# Patient Record
Sex: Female | Born: 1965 | Race: White | Hispanic: No | Marital: Married | Smoking: Former smoker
Health system: Southern US, Community
[De-identification: ages and names within clinical notes are randomized; demographics above are authoritative.]

## PROBLEM LIST (undated history)

## (undated) DIAGNOSIS — E785 Hyperlipidemia, unspecified: Secondary | ICD-10-CM

## (undated) DIAGNOSIS — F419 Anxiety disorder, unspecified: Secondary | ICD-10-CM

## (undated) DIAGNOSIS — K219 Gastro-esophageal reflux disease without esophagitis: Secondary | ICD-10-CM

## (undated) HISTORY — DX: Gastro-esophageal reflux disease without esophagitis: K21.9

## (undated) HISTORY — DX: Hyperlipidemia, unspecified: E78.5

## (undated) HISTORY — PX: OVARIAN CYST SURGERY: SHX726

## (undated) HISTORY — DX: Anxiety disorder, unspecified: F41.9

---

## 1997-11-24 ENCOUNTER — Ambulatory Visit (HOSPITAL_COMMUNITY): Admission: RE | Admit: 1997-11-24 | Discharge: 1997-11-24 | Payer: Self-pay | Admitting: Gynecology

## 1998-01-24 ENCOUNTER — Other Ambulatory Visit: Admission: RE | Admit: 1998-01-24 | Discharge: 1998-01-24 | Payer: Self-pay | Admitting: Obstetrics and Gynecology

## 1998-02-04 ENCOUNTER — Inpatient Hospital Stay (HOSPITAL_COMMUNITY): Admission: AD | Admit: 1998-02-04 | Discharge: 1998-02-07 | Payer: Self-pay | Admitting: Obstetrics and Gynecology

## 1999-01-05 ENCOUNTER — Encounter: Payer: Self-pay | Admitting: Obstetrics and Gynecology

## 1999-01-05 ENCOUNTER — Ambulatory Visit (HOSPITAL_COMMUNITY): Admission: RE | Admit: 1999-01-05 | Discharge: 1999-01-05 | Payer: Self-pay | Admitting: Obstetrics and Gynecology

## 1999-03-02 ENCOUNTER — Encounter: Admission: RE | Admit: 1999-03-02 | Discharge: 1999-05-31 | Payer: Self-pay | Admitting: Family Medicine

## 1999-05-22 ENCOUNTER — Other Ambulatory Visit: Admission: RE | Admit: 1999-05-22 | Discharge: 1999-05-22 | Payer: Self-pay | Admitting: Obstetrics and Gynecology

## 1999-09-18 ENCOUNTER — Ambulatory Visit (HOSPITAL_COMMUNITY): Admission: RE | Admit: 1999-09-18 | Discharge: 1999-09-18 | Payer: Self-pay | Admitting: Gynecology

## 1999-09-18 ENCOUNTER — Encounter: Payer: Self-pay | Admitting: Gynecology

## 2000-05-22 ENCOUNTER — Other Ambulatory Visit: Admission: RE | Admit: 2000-05-22 | Discharge: 2000-05-22 | Payer: Self-pay | Admitting: Gynecology

## 2001-07-28 ENCOUNTER — Other Ambulatory Visit: Admission: RE | Admit: 2001-07-28 | Discharge: 2001-07-28 | Payer: Self-pay | Admitting: Gynecology

## 2001-11-05 ENCOUNTER — Encounter: Admission: RE | Admit: 2001-11-05 | Discharge: 2001-11-05 | Payer: Self-pay | Admitting: Family Medicine

## 2001-11-05 ENCOUNTER — Encounter: Payer: Self-pay | Admitting: Family Medicine

## 2002-08-05 ENCOUNTER — Other Ambulatory Visit: Admission: RE | Admit: 2002-08-05 | Discharge: 2002-08-05 | Payer: Self-pay | Admitting: Gynecology

## 2003-08-09 ENCOUNTER — Other Ambulatory Visit: Admission: RE | Admit: 2003-08-09 | Discharge: 2003-08-09 | Payer: Self-pay | Admitting: Gynecology

## 2003-10-13 ENCOUNTER — Ambulatory Visit (HOSPITAL_COMMUNITY): Admission: RE | Admit: 2003-10-13 | Discharge: 2003-10-13 | Payer: Self-pay | Admitting: *Deleted

## 2004-08-09 ENCOUNTER — Other Ambulatory Visit: Admission: RE | Admit: 2004-08-09 | Discharge: 2004-08-09 | Payer: Self-pay | Admitting: Gynecology

## 2005-08-22 ENCOUNTER — Other Ambulatory Visit: Admission: RE | Admit: 2005-08-22 | Discharge: 2005-08-22 | Payer: Self-pay | Admitting: Gynecology

## 2005-10-23 ENCOUNTER — Ambulatory Visit (HOSPITAL_BASED_OUTPATIENT_CLINIC_OR_DEPARTMENT_OTHER): Admission: RE | Admit: 2005-10-23 | Discharge: 2005-10-23 | Payer: Self-pay | Admitting: Gynecology

## 2005-10-23 ENCOUNTER — Encounter (INDEPENDENT_AMBULATORY_CARE_PROVIDER_SITE_OTHER): Payer: Self-pay | Admitting: *Deleted

## 2006-12-23 ENCOUNTER — Other Ambulatory Visit: Admission: RE | Admit: 2006-12-23 | Discharge: 2006-12-23 | Payer: Self-pay | Admitting: Gynecology

## 2008-03-19 ENCOUNTER — Encounter: Admission: RE | Admit: 2008-03-19 | Discharge: 2008-03-19 | Payer: Self-pay | Admitting: Obstetrics and Gynecology

## 2010-06-17 ENCOUNTER — Emergency Department (HOSPITAL_COMMUNITY)
Admission: EM | Admit: 2010-06-17 | Discharge: 2010-06-17 | Payer: Self-pay | Source: Home / Self Care | Admitting: Emergency Medicine

## 2010-07-17 ENCOUNTER — Emergency Department (HOSPITAL_BASED_OUTPATIENT_CLINIC_OR_DEPARTMENT_OTHER)
Admission: EM | Admit: 2010-07-17 | Discharge: 2010-07-18 | Payer: Self-pay | Source: Home / Self Care | Admitting: Emergency Medicine

## 2010-07-19 LAB — URINE MICROSCOPIC-ADD ON

## 2010-07-19 LAB — URINALYSIS, ROUTINE W REFLEX MICROSCOPIC
Bilirubin Urine: NEGATIVE
Ketones, ur: NEGATIVE mg/dL
Nitrite: POSITIVE — AB
Protein, ur: NEGATIVE mg/dL
Specific Gravity, Urine: 1.02 (ref 1.005–1.030)
Urine Glucose, Fasting: NEGATIVE mg/dL
Urobilinogen, UA: 0.2 mg/dL (ref 0.0–1.0)
pH: 5.5 (ref 5.0–8.0)

## 2010-07-19 LAB — PREGNANCY, URINE: Preg Test, Ur: NEGATIVE

## 2010-07-23 ENCOUNTER — Encounter: Payer: Self-pay | Admitting: Family Medicine

## 2010-07-24 LAB — URINE CULTURE
Colony Count: >=100000
Culture  Setup Time: 201201170543

## 2010-09-11 LAB — URINE MICROSCOPIC-ADD ON

## 2010-09-11 LAB — URINALYSIS, ROUTINE W REFLEX MICROSCOPIC
Bilirubin Urine: NEGATIVE
Glucose, UA: NEGATIVE mg/dL
Ketones, ur: NEGATIVE mg/dL
Nitrite: NEGATIVE
Protein, ur: 30 mg/dL — AB
Specific Gravity, Urine: 1.028 (ref 1.005–1.030)
Urobilinogen, UA: 1 mg/dL (ref 0.0–1.0)
pH: 6.5 (ref 5.0–8.0)

## 2010-09-11 LAB — POCT PREGNANCY, URINE: Preg Test, Ur: NEGATIVE

## 2010-11-17 NOTE — Op Note (Signed)
NAMEMARQUETA, [name]              ACCOUNT NO.:  [id number]   MEDICAL RECORD NO.:  [id number]          PATIENT TYPE:  AMB   LOCATION:  [facility]                         FACILITY:  [facility]   PHYSICIAN:  Ivor Costa. Farrel Gobble, M.D. DATE OF BIRTH:  Jul 12, 1965   DATE OF PROCEDURE:  10/23/2005  DATE OF DISCHARGE:                                 OPERATIVE REPORT   PREOPERATIVE DIAGNOSIS:  Persistent complex left ovarian mass.   POSTOPERATIVE DIAGNOSIS:  Persistent complex left ovarian mass.   PROCEDURE:  Laparoscopic ovarian cystectomy and pelvic washing.   SURGEON:  Ivor Costa. Farrel Gobble, M.D.   ASSISTANTMarcial Pacas P. Fontaine, M.D.   ANESTHESIA:  General.   ESTIMATED BLOOD LOSS:  Minimal.   FINDINGS:  The left ovary was noted to be enlarged, it had smooth serosal  surfaces.  There was a left paratubal cyst.  The right adnexa was normal as  was the uterus and upper abdomen.   COMPLICATIONS:  None.   PATHOLOGY:  An ovarian cyst and a portion of a second cyst with cyst wall  sent as separate specimens.   PROCEDURE:  The patient was taken to the operating room, general anesthesia  was induced.  Placed in the dorsal lithotomy position, prepped and draped in  usual sterile fashion.  A bivalve speculum was placed in the vagina, the  cervix was visualized and a uterine manipulator was placed.  Gloves were  changed and an infraumbilical incision was made with a scalpel, through  which the Veress needle was inserted, the opening pressure was 7 and a  pneumoperitoneum was created until tympany was appreciated about the liver.  After which, a 10/11 disposable trocar was inserted through the  infraumbilical port and placement in the abdomen was confirmed.  The patient  was then placed in Trendelenburg position.  Two 5 mm ports were made in the  lower abdomen under direct visualization.  Once these ports were in place,  the bowels were swept out of the pelvis and the pelvic washings were  obtained.  The  ovary was then grasped at the tubo-ovarian ligament and held  for stabilization.  The Endoshears with cautery were then used to score the  serosa of the ovary and then the serosa was incised.  The capsule of the  ovary was then grasped and the cyst was begun to be sharply dissected off  with the Endoshears.  An incidental cystotomy occurred during this process  of clear fluid.  The cyst was able to be grasped and removed through the  umbilical port.  The ovary still appeared to be slightly enlarged and the  incision was carried a little more medially and a second cystic-like mass  was removed in a similar fashion.  The remainder of the ovary was probed and  was felt to be within normal limits.  The surface of the cyst was then  treated with cautery, the pelvis was irrigated to confirm that the cyst bed  itself was hemostatic.  The instruments were then removed from the abdomen  under direct visualization.  The infraumbilical port was closed with #0  Vicryl, the skin was closed with #3-0 plain.  The lower port, because of  bleeding at the skin, was also closed with #3-0 plain.  The three ports were  injected with a total of 10 mL of 0.25% Marcaine.  The patient tolerated the  procedure well.  Sponge and needle counts were correct x2 and she was  transferred to the PACU in stable condition.      Ivor Costa. Farrel Gobble, M.D.  Electronically Signed     THL/MEDQ  D:  10/23/2005  T:  10/23/2005  Job:  [id number]

## 2010-11-17 NOTE — H&P (Signed)
[name], [name]              ACCOUNT NO.:  [id number]   MEDICAL RECORD NO.:  [id number]          PATIENT TYPE:  AMB   LOCATION:  [facility]                         [facility]ACILITY:  [facility]   PHYSICIAN:  Ivor Costa. Farrel Gobble, M.D. DATE OF BIRTH:  Apr 28, 1966   DATE OF ADMISSION:  DATE OF DISCHARGE:                                HISTORY & PHYSICAL   CHIEF COMPLAINT:  Persistent left ovarian mass.   HISTORY OF PRESENT ILLNESS:  The patient is a 45 year old who was diagnosed  with a complex cyst on the left side in December of 2006 when she had  presented to urgent care with pelvic pain.  The patient followed back up in  our office for a repeat ultrasound, which basically showed the cyst to be  unchanged.  There was only about 4 weeks between the first 2 scans, and the  patient therefore represented back for a follow up scan in April, which  shows the cyst to be persistent in size.  It is cystic, solid in nature,  with measurements of 2.8 x 3 x 2.1.  It is noted to be thick walled.  It is  unchanged across 3 scans.  The remainder of her pelvis is unremarkable.  The  patient had a CA125, which is normal.  She does report pain on the left hand  side.  The patient, otherwise, has no other complaints.  She has had 2  cesarean sections.  She uses contraception with a vasectomy.  She has no  dyspareunia, and her cycles are 28 days.   PAST MEDICAL HISTORY:  Negative.   SURGICAL HISTORY:  Cesarean section in 1991 and 1999.   SOCIAL HISTORY:  Negative for ovarian, uterine or colon cancer.  There is  breast cancer in a grandmother.   SOCIAL HISTORY:  Negative for tobacco.  Social alcohol.  No formal exercise.   PHYSICAL EXAMINATION:  GENERAL:  She is well appearing, in no acute  distress.  HEART:  Regular rate.  LUNGS:  Clear to auscultation.  ABDOMEN:  Soft and nontender.  GYN:  She has thick white discharge in the vault, and a wet prep was taken.  The cervix is without gross lesions.  The uterus is  mobile and nontender, as  were the adnexa.  RECTOVAGINAL:  Deferred.   ASSESSMENT:  Persistent left ovarian cyst.   The patient will present to the OR for a laparoscopic ovarian cystectomy,  possible oophorectomy with pelvic washings.  Risks and benefits of the  procedure were discussed.  Postoperative pain medication in the form of  Tylox was given.      Ivor Costa. Farrel Gobble, M.D.  Electronically Signed     THL/MEDQ  D:  10/19/2005  T:  10/19/2005  Job:  [id number]

## 2013-12-25 DIAGNOSIS — E559 Vitamin D deficiency, unspecified: Secondary | ICD-10-CM | POA: Insufficient documentation

## 2013-12-25 DIAGNOSIS — IMO0001 Reserved for inherently not codable concepts without codable children: Secondary | ICD-10-CM | POA: Insufficient documentation

## 2013-12-25 DIAGNOSIS — K219 Gastro-esophageal reflux disease without esophagitis: Secondary | ICD-10-CM | POA: Insufficient documentation

## 2013-12-25 DIAGNOSIS — G4700 Insomnia, unspecified: Secondary | ICD-10-CM | POA: Insufficient documentation

## 2013-12-25 DIAGNOSIS — R350 Frequency of micturition: Secondary | ICD-10-CM | POA: Insufficient documentation

## 2013-12-25 DIAGNOSIS — R5383 Other fatigue: Secondary | ICD-10-CM | POA: Insufficient documentation

## 2014-04-27 DIAGNOSIS — F411 Generalized anxiety disorder: Secondary | ICD-10-CM | POA: Insufficient documentation

## 2014-04-27 DIAGNOSIS — E7800 Pure hypercholesterolemia, unspecified: Secondary | ICD-10-CM | POA: Insufficient documentation

## 2014-04-27 DIAGNOSIS — R0989 Other specified symptoms and signs involving the circulatory and respiratory systems: Secondary | ICD-10-CM | POA: Insufficient documentation

## 2014-06-08 ENCOUNTER — Other Ambulatory Visit: Payer: Self-pay | Admitting: Gastroenterology

## 2014-06-08 DIAGNOSIS — R4702 Dysphasia: Secondary | ICD-10-CM

## 2014-06-11 ENCOUNTER — Other Ambulatory Visit: Payer: Self-pay | Admitting: Gastroenterology

## 2014-06-11 ENCOUNTER — Other Ambulatory Visit: Payer: Self-pay

## 2014-06-11 ENCOUNTER — Ambulatory Visit
Admission: RE | Admit: 2014-06-11 | Discharge: 2014-06-11 | Disposition: A | Discharge status: Home / Self Care | Payer: 59 | Source: Ambulatory Visit | Attending: Gastroenterology | Admitting: Gastroenterology

## 2014-06-11 DIAGNOSIS — R4702 Dysphasia: Secondary | ICD-10-CM

## 2015-07-15 DIAGNOSIS — Z2821 Immunization not carried out because of patient refusal: Secondary | ICD-10-CM | POA: Insufficient documentation

## 2016-02-24 ENCOUNTER — Other Ambulatory Visit: Payer: Self-pay

## 2016-02-24 ENCOUNTER — Other Ambulatory Visit: Payer: Self-pay | Admitting: Obstetrics and Gynecology

## 2016-02-24 DIAGNOSIS — R109 Unspecified abdominal pain: Secondary | ICD-10-CM

## 2016-02-24 DIAGNOSIS — R1030 Lower abdominal pain, unspecified: Secondary | ICD-10-CM

## 2016-02-29 ENCOUNTER — Ambulatory Visit
Admission: RE | Admit: 2016-02-29 | Discharge: 2016-02-29 | Disposition: A | Discharge status: Home / Self Care | Payer: 59 | Source: Ambulatory Visit | Attending: Obstetrics and Gynecology | Admitting: Obstetrics and Gynecology

## 2016-02-29 DIAGNOSIS — R109 Unspecified abdominal pain: Secondary | ICD-10-CM

## 2016-02-29 DIAGNOSIS — R1030 Lower abdominal pain, unspecified: Secondary | ICD-10-CM

## 2016-08-03 ENCOUNTER — Encounter: Payer: Self-pay | Admitting: Gastroenterology

## 2016-09-10 ENCOUNTER — Encounter: Payer: Self-pay | Admitting: Gastroenterology

## 2016-09-10 ENCOUNTER — Ambulatory Visit (INDEPENDENT_AMBULATORY_CARE_PROVIDER_SITE_OTHER): Payer: 59 | Admitting: Gastroenterology

## 2016-09-10 VITALS — BP 128/76 | HR 70 | Ht 66.75 in | Wt 158.0 lb

## 2016-09-10 DIAGNOSIS — K6289 Other specified diseases of anus and rectum: Secondary | ICD-10-CM | POA: Diagnosis not present

## 2016-09-10 DIAGNOSIS — K625 Hemorrhage of anus and rectum: Secondary | ICD-10-CM

## 2016-09-10 DIAGNOSIS — K5900 Constipation, unspecified: Secondary | ICD-10-CM | POA: Diagnosis not present

## 2016-09-10 DIAGNOSIS — R58 Hemorrhage, not elsewhere classified: Secondary | ICD-10-CM

## 2016-09-10 MED ORDER — LINACLOTIDE 72 MCG PO CAPS: 72.0000 ug | ORAL_CAPSULE | Freq: Every day | ORAL | 3 refills | Status: DC

## 2016-09-10 NOTE — Progress Notes (Signed)
Edward Trevino    [id number]    [birth year]  Primary Care Physician:KELLY,SAM, MD  Referring Physician: No referring provider defined for this encounter.  Chief complaint:  Rectal pain  HPI: 51 year old female here for new patient evaluation with complaints of rectal pain just prior to defecation for past few months. She has history of chronic constipation with bowel movements once every week. Not on any laxatives, Denies any abdominal discomfort or pain. No nausea or vomiting. She has occasional heartburn, acid reflux symptoms. She has pain in the rectum and also back pain  when she has a bowel movement and has noticed intermittently bright red blood per rectum when she wipes and also in the toilet. She was evaluated by [facility] about 2 years ago and has had colonoscopy with removal of a polyp per patient, report not available during this visit to review. she was informed that the polyp was benign No change in weight. She has history of ovarian serous cystadenoma.  No uterine fibroids. Had recent GYN ultrasound that was unremarkable per patient   Outpatient Encounter Prescriptions as of 09/10/2016  Medication Sig  . buPROPion (WELLBUTRIN XL) 150 MG 24 hr tablet Take 150 mg by mouth daily.   Marland Kitchen escitalopram (LEXAPRO) 20 MG tablet Take 20 mg by mouth daily.   Marland Kitchen omeprazole (PRILOSEC) 40 MG capsule Take 40 mg by mouth daily.   . pravastatin (PRAVACHOL) 40 MG tablet Take 40 mg by mouth daily.   No facility-administered encounter medications on file as of 09/10/2016.     Allergies as of 09/10/2016  . (Not on File)    Past Medical History:  Diagnosis Date  . Anxiety   . GERD (gastroesophageal reflux disease)   . Hyperlipidemia     Past Surgical History:  Procedure Laterality Date  . CESAREAN SECTION    . OVARIAN CYST SURGERY      Family History  Problem Relation Age of Onset  . Hypertension Mother   . Prostate cancer Father   . Heart disease Father   . Breast  cancer Maternal Grandmother     Social History   Social History  . Marital status: Married    Spouse name: N/A  . Number of children: 2  . Years of education: N/A   Occupational History  . Emergency planning/management officer    Social History Main Topics  . Smoking status: Former Games developer  . Smokeless tobacco: Never Used  . Alcohol use No  . Drug use: Yes    Frequency: 7.0 times per week    Types: Marijuana  . Sexual activity: Not on file   Other Topics Concern  . Not on file   Social History Narrative  . No narrative on file      Review of systems: Review of Systems  Constitutional: Negative for fever and chills.  HENT: Negative.   Eyes: Negative for blurred vision.  Respiratory: Negative for cough, shortness of breath and wheezing.   Cardiovascular: Negative for chest pain and palpitations.  Gastrointestinal: as per HPI Genitourinary: Negative for dysuria, urgency, frequency and hematuria.  Musculoskeletal: Negative for myalgias, back pain and joint pain.  Skin: Negative for itching and rash.  Neurological: Negative for dizziness, tremors, focal weakness, seizures and loss of consciousness.  Endo/Heme/Allergies: Positive for seasonal allergies.  Psychiatric/Behavioral: Negative for depression, suicidal ideas and hallucinations.  All other systems reviewed and are negative.   Physical Exam: Vitals:   09/10/16 0931  BP:  128/76  Pulse: 70   Body mass index is 24.93 kg/m. Gen:      No acute distress HEENT:  EOMI, sclera anicteric Neck:     No masses; no thyromegaly Lungs:    Clear to auscultation bilaterally; normal respiratory effort CV:         Regular rate and rhythm; no murmurs Abd:      + bowel sounds; soft, non-tender; no palpable masses, no distension Ext:    No edema; adequate peripheral perfusion Skin:      Warm and dry; no rash Neuro: alert and oriented x 3 Psych: normal mood and affect Rectal exam: Normal anal sphincter tone, no anal fissure or external  hemorrhoids Anoscopy: Small internal hemorrhoids, no active bleeding, normal dentate line, no visible nodules  Data Reviewed:  Reviewed labs, radiology imaging, old records and pertinent past GI work up   Assessment and Plan/Recommendations: [age] yr F here with c/o chronic constipation, rectal pain with defecation and intermittent bright red blood per rectum  Rectal/back pain could be related to increased stool burden No evidence of anal fistula or fissure. No tenderness on rectal exam Start Linzess 72 mcg daily Increase dietary fiber and fluid intake  Intermittent blood per rectum: she does have Grade 2 internal hemorrhoids Small volume bleeding likely secondary to hemorrhoidal bleeding but cannot exclude proctitis or inflammatory polyp or malignancy [facility], it was done 2 years ago per patient Preparation H for 7-10 days as needed  Reassess in 4 weeks, if no improvement will consider flex sig or colonoscopy for further evaluation   Greater than 50% of the time used for counseling as well as treatment plan and follow-up. She had multiple questions which were answered to her satisfaction  K. Scherry RanVeena Breezy Hertenstein , MD [phone] Mon-Fri 8a-5p [phone] after 5p, weekends, holidays  CC: No ref. provider found

## 2016-09-10 NOTE — Patient Instructions (Signed)
[facility]  Take Linzess 72 mcg daily, we will send to your pharmacy  Increase Fluid Intake 8-10 cups daily  Use Preparation H per rectum at bedtime for 7-10 days

## 2016-10-22 ENCOUNTER — Ambulatory Visit: Payer: Self-pay | Admitting: Gastroenterology

## 2017-06-04 DIAGNOSIS — M545 Low back pain, unspecified: Secondary | ICD-10-CM | POA: Insufficient documentation

## 2017-06-04 DIAGNOSIS — G8929 Other chronic pain: Secondary | ICD-10-CM | POA: Insufficient documentation

## 2017-09-17 ENCOUNTER — Other Ambulatory Visit: Payer: Self-pay | Admitting: Obstetrics and Gynecology

## 2017-09-17 DIAGNOSIS — E229 Hyperfunction of pituitary gland, unspecified: Principal | ICD-10-CM

## 2017-09-17 DIAGNOSIS — R7989 Other specified abnormal findings of blood chemistry: Secondary | ICD-10-CM

## 2017-09-19 ENCOUNTER — Ambulatory Visit
Admission: RE | Admit: 2017-09-19 | Discharge: 2017-09-19 | Disposition: A | Discharge status: Home / Self Care | Payer: 59 | Source: Ambulatory Visit | Attending: Obstetrics and Gynecology | Admitting: Obstetrics and Gynecology

## 2017-09-19 DIAGNOSIS — E229 Hyperfunction of pituitary gland, unspecified: Principal | ICD-10-CM

## 2017-09-19 DIAGNOSIS — R7989 Other specified abnormal findings of blood chemistry: Secondary | ICD-10-CM

## 2017-09-19 MED ORDER — GADOBENATE DIMEGLUMINE 529 MG/ML IV SOLN
10.0000 mL | Freq: Once | INTRAVENOUS | Status: AC | PRN
  Administered 2017-09-19: 10 mL via INTRAVENOUS

## 2017-10-07 ENCOUNTER — Ambulatory Visit (INDEPENDENT_AMBULATORY_CARE_PROVIDER_SITE_OTHER): Payer: 59

## 2017-10-07 ENCOUNTER — Encounter: Payer: Self-pay | Admitting: Podiatry

## 2017-10-07 ENCOUNTER — Ambulatory Visit: Payer: 59 | Admitting: Podiatry

## 2017-10-07 DIAGNOSIS — M779 Enthesopathy, unspecified: Secondary | ICD-10-CM | POA: Diagnosis not present

## 2017-10-07 DIAGNOSIS — M2012 Hallux valgus (acquired), left foot: Secondary | ICD-10-CM

## 2017-10-07 DIAGNOSIS — M2011 Hallux valgus (acquired), right foot: Secondary | ICD-10-CM | POA: Diagnosis not present

## 2017-10-07 DIAGNOSIS — M7752 Other enthesopathy of left foot: Secondary | ICD-10-CM

## 2017-10-07 DIAGNOSIS — M79675 Pain in left toe(s): Secondary | ICD-10-CM

## 2017-10-07 MED ORDER — METHYLPREDNISOLONE 4 MG PO TBPK: ORAL_TABLET | ORAL | 0 refills | Status: AC

## 2017-10-07 MED ORDER — MELOXICAM 15 MG PO TABS: 15.0000 mg | ORAL_TABLET | Freq: Every day | ORAL | 1 refills | Status: AC

## 2017-10-08 ENCOUNTER — Other Ambulatory Visit: Payer: Self-pay | Admitting: Podiatry

## 2017-10-08 DIAGNOSIS — M2012 Hallux valgus (acquired), left foot: Principal | ICD-10-CM

## 2017-10-08 DIAGNOSIS — M2011 Hallux valgus (acquired), right foot: Secondary | ICD-10-CM

## 2017-10-09 NOTE — Progress Notes (Signed)
   Subjective: 52 year old female presenting today as a new patient with a chief complaint of shooting, throbbing pain to the bilateral 1st MPJs secondary to bunions that have been symptomatic for the past three weeks. She states the left is worse than the right. She has not done anything to treat the pain. Standing and walking increase the symptoms. Patient is here for further evaluation and treatment.   Past Medical History:  Diagnosis Date  . Anxiety   . GERD (gastroesophageal reflux disease)   . Hyperlipidemia       Objective: Physical Exam General: The patient is alert and oriented x3 in no acute distress.  Dermatology: Skin is cool, dry and supple bilateral lower extremities. Negative for open lesions or macerations.  Vascular: Palpable pedal pulses bilaterally. No edema or erythema noted. Capillary refill within normal limits.  Neurological: Epicritic and protective threshold grossly intact bilaterally.   Musculoskeletal Exam: Clinical evidence of bunion deformity noted to the respective foot. There is a moderate pain on palpation range of motion of the first MPJ. Lateral deviation of the hallux noted consistent with hallux abductovalgus.  Radiographic Exam: Increased intermetatarsal angle greater than 15 with a hallux abductus angle greater than 30 noted on AP view. Moderate degenerative changes noted within the first MPJ.  Assessment: 1. HAV w/ bunion deformity bilateral 2. 1st MPJ capsulitis left    Plan of Care:  1. Patient was evaluated. X-Rays reviewed. 2. Declined injections today.  3. Prescription for Medrol Dose Pak provided to patient.  4. Prescription for Meloxicam provided to patient.  5. Recommended good shoe gear.  6. Return to clinic in 4 weeks.    Felecia ShellingBrent M. Evans, DPM [facility]  Dr. Felecia ShellingBrent M. Evans, DPM    [address]                                        [place], [place] [address]                Office [phone]  Fax  [phone]

## 2017-11-04 ENCOUNTER — Ambulatory Visit: Payer: 59 | Admitting: Podiatry

## 2017-11-13 ENCOUNTER — Ambulatory Visit: Payer: 59 | Admitting: Podiatry

## 2017-12-02 ENCOUNTER — Encounter: Payer: 59 | Admitting: Podiatry

## 2017-12-05 NOTE — Progress Notes (Signed)
This encounter was created in error - please disregard.

## 2019-08-28 IMAGING — MR MR HEAD WO/W CM
16 of 19 series · 35 of 48 positions shown · IV contrast (10 ML MULTIHANCE)
Comparison: None.

CLINICAL DATA: Elevated prolactin level.  Decreased vision.

Creatinine was obtained on site at [HOSPITAL] at [HOSPITAL].
Results: Creatinine 1.2 mg/dL.
EXAM:
MRI HEAD WITHOUT AND WITH CONTRAST
TECHNIQUE: Multiplanar, multiecho pulse sequences of the brain and surrounding
structures were obtained without and with intravenous contrast.
CONTRAST:  10mL MULTIHANCE GADOBENATE DIMEGLUMINE 529 MG/ML IV SOLN

[Series 2: t1_se_sag · sagittal · 5.0mm · 0.45mm/px · 1 of 19 slices shown]
[im 1/19]
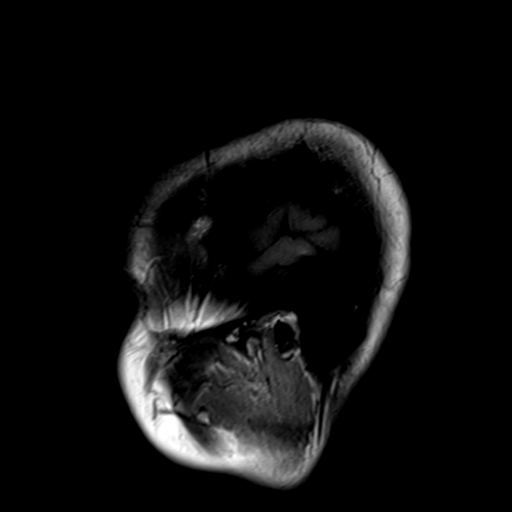

[Series 3: ep2d_diff_(id)_trace · axial · 3.0mm · 1.80mm/px · z∈[-31,+116]mm · 8 of 100 slices shown]
[im 1/100]
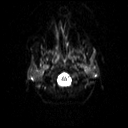
[im 20/100]
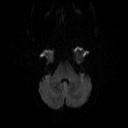
[im 30/100]
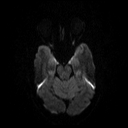
[im 40/100]
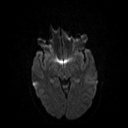
[im 60/100]
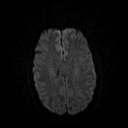
[im 70/100]
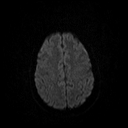
[im 80/100]
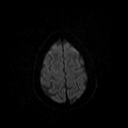
[im 100/100]
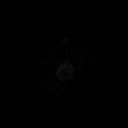

[Series 4: ep2d_diff_(id)_trace_adc · axial · 3.0mm · 1.80mm/px · z∈[-31,+116]mm · 5 of 49 slices shown]
[im 1/49]
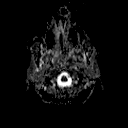
[im 13/49]
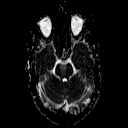
[im 25/49]
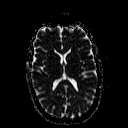
[im 37/49]
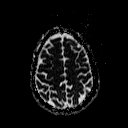
[im 49/49]
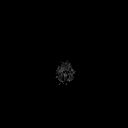

[Series 5: T2 · axial · 5.0mm · 0.45mm/px · z∈[-26,+111]mm · 3 of 22 slices shown]
[im 1/22]
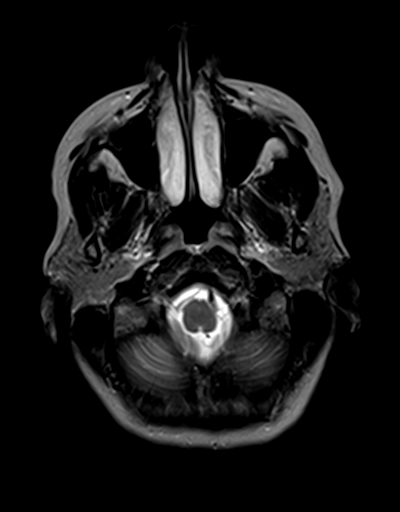
[im 11/22]
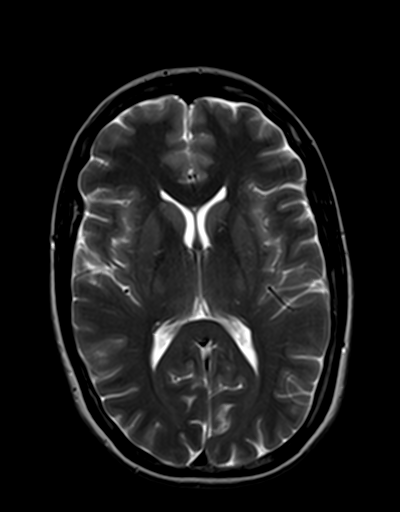
[im 22/22]
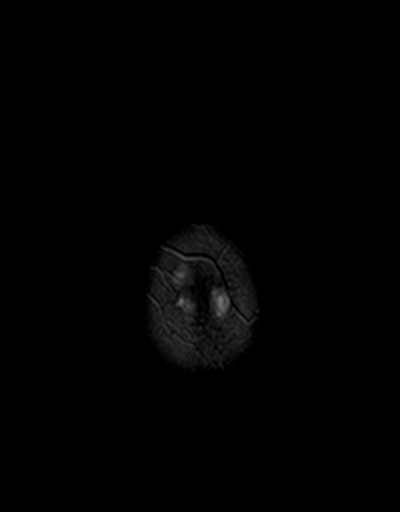

[Series 6: FLAIR · axial · 5.0mm · 0.45mm/px · z∈[-25,+111]mm · 3 of 22 slices shown]
[im 1/22]
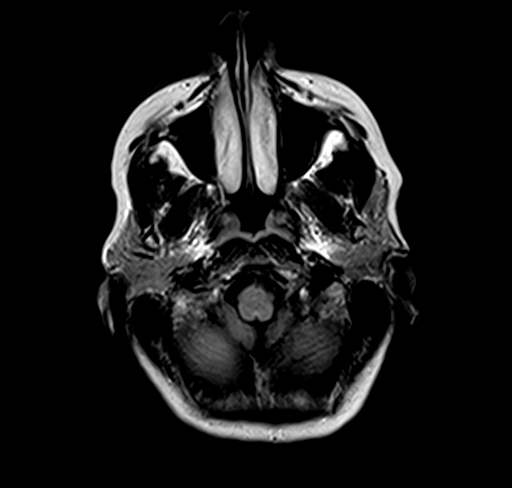
[im 11/22]
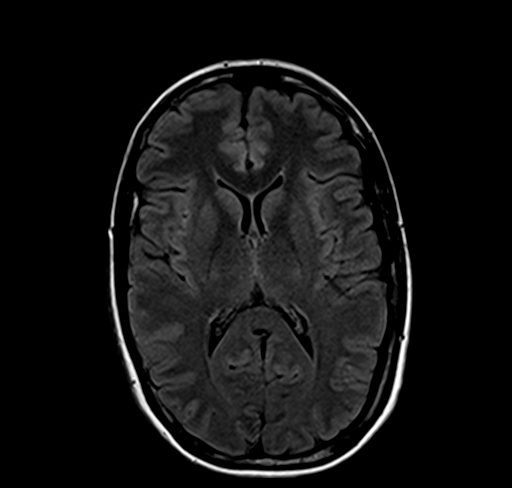
[im 22/22]
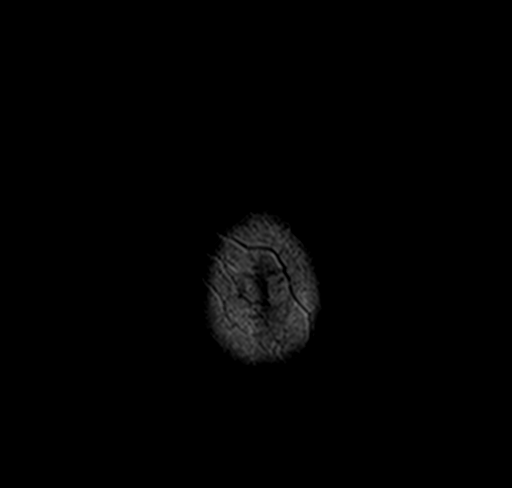

[Series 7: GRE · axial · 5.0mm · 0.45mm/px · z∈[-26,+110]mm · 3 of 22 slices shown]
[im 1/22]
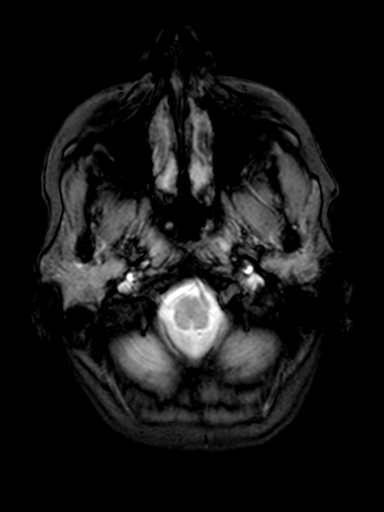
[im 11/22]
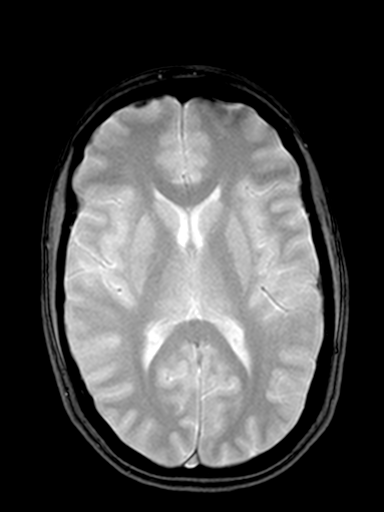
[im 22/22]
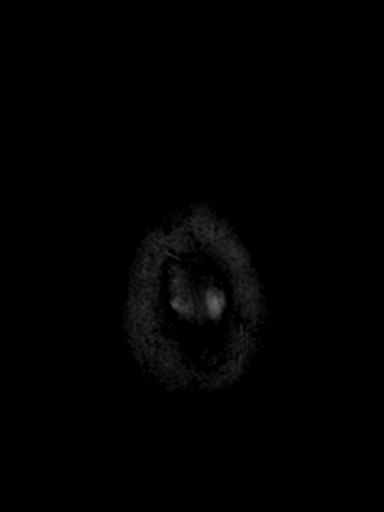

[Series 8: T1 · sagittal · 3.0mm · 0.35mm/px · 1 of 11 slices shown (1 of 2)]
[im 1/11]
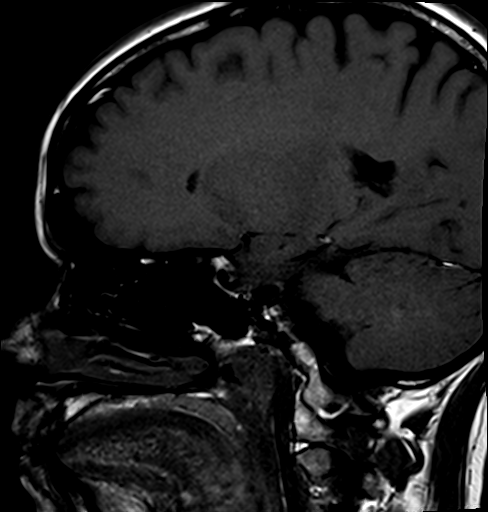

[Series 9: T1 · coronal · 3.0mm · 0.35mm/px · 1 of 11 slices shown (2 of 2)]
[im 1/11]
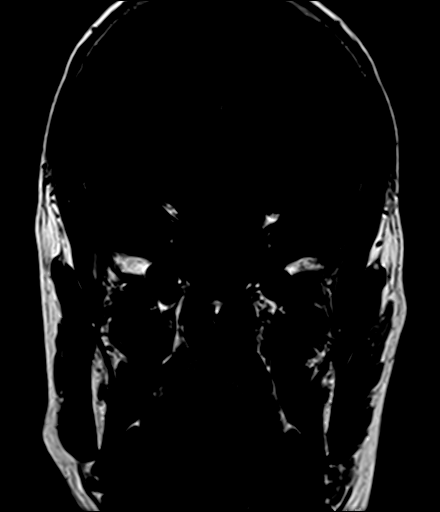

[Series 10: pre cor · coronal · non-contrast · 3.0mm · 0.35mm/px · 1 of 6 slices shown]
[im 1/6]
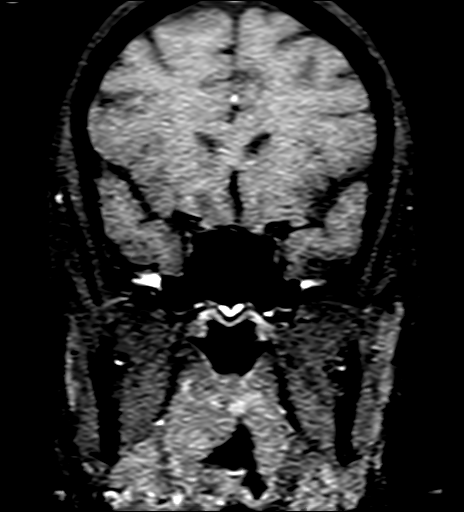

[Series 11: post cor dynamic · coronal · 3.0mm · 0.35mm/px · 1 of 6 slices shown (1 of 4)]
[im 1/6]
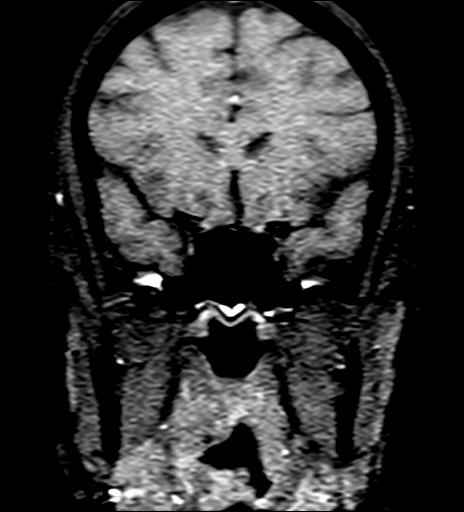

[Series 12: post cor dynamic · coronal · 3.0mm · 0.35mm/px · 1 of 6 slices shown (2 of 4)]
[im 1/6]
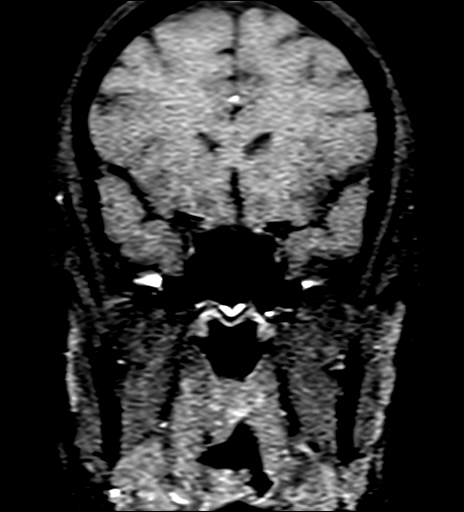

[Series 13: post cor dynamic · coronal · 3.0mm · 0.35mm/px · 1 of 6 slices shown (3 of 4)]
[im 1/6]
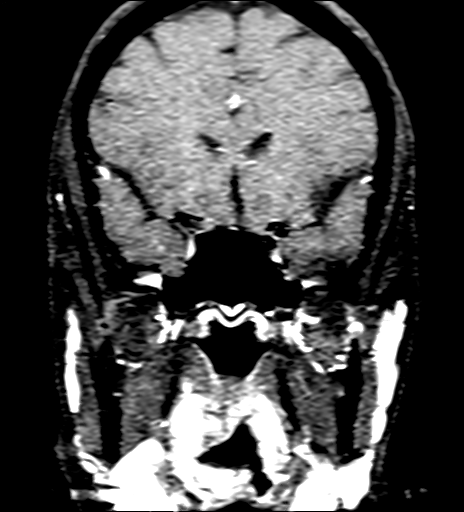

[Series 14: post cor dynamic · coronal · 3.0mm · 0.35mm/px · 1 of 6 slices shown (4 of 4)]
[im 1/6]
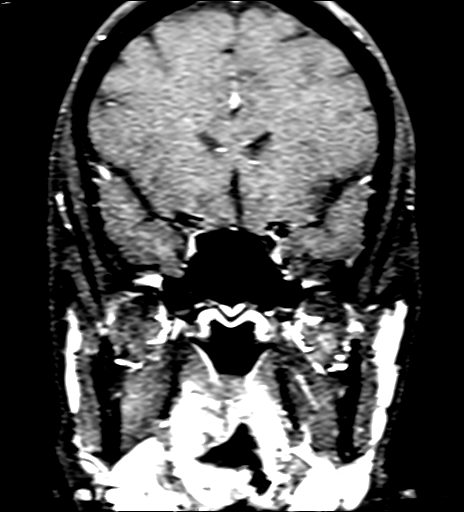

[Series 17: T1 post-contrast · coronal · 3.0mm · 0.35mm/px · 1 of 11 slices shown (1 of 3)]
[im 1/11]
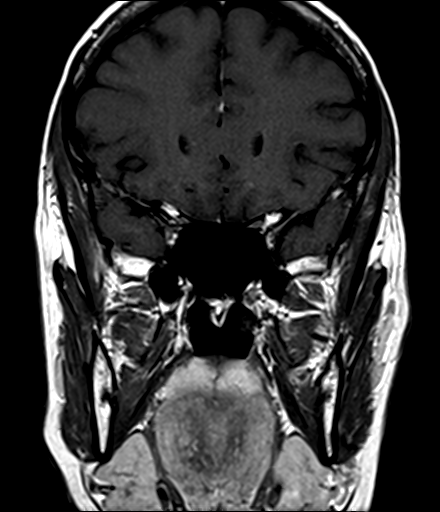

[Series 18: T1 post-contrast · sagittal · 3.0mm · 0.35mm/px · 1 of 11 slices shown (2 of 3)]
[im 1/11]
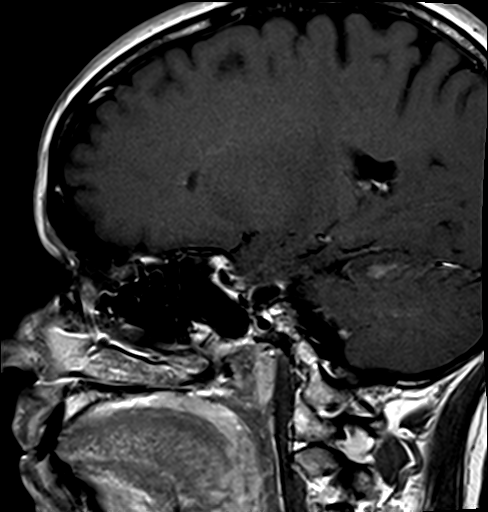

[Series 20: T1 post-contrast · coronal · 5.0mm · 0.69mm/px · 3 of 25 slices shown (3 of 3)]
[im 1/25]
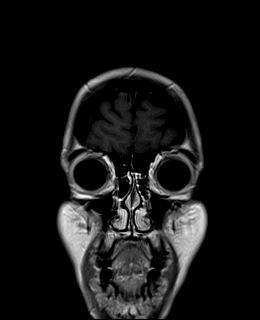
[im 13/25]
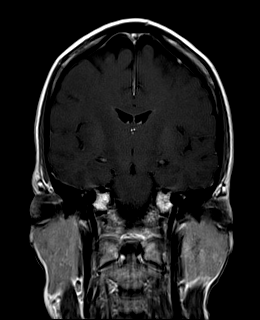
[im 25/25]
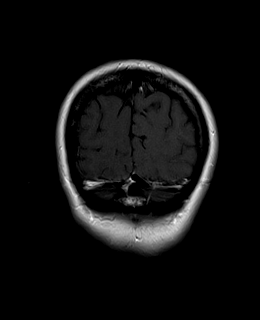

[35 of 48 positions shown; findings below may reference images not displayed]

FINDINGS: Brain: There is no evidence of acute infarct, intracranial
hemorrhage, mass, midline shift, or extra-axial fluid collection.
The ventricles and sulci are normal. The brain is normal in signal.
No abnormal enhancement is identified.

Dedicated pituitary imaging was performed. The pituitary is normal
in size and morphology. There is a normal posterior pituitary bright
spot. Pituitary enhancement is normal without a sellar or
suprasellar lesion identified. The infundibulum is midline. The
optic chiasm and cavernous sinuses are unremarkable.

Vascular: Major intracranial vascular flow voids are preserved with
the left vertebral artery appearing dominant.

Skull and upper cervical spine: Unremarkable bone marrow signal.

Sinuses/Orbits: Unremarkable orbits. Paranasal sinuses and mastoid
air cells are clear.

Other: None.
IMPRESSION: Negative brain and pituitary imaging.

## 2021-08-26 ENCOUNTER — Other Ambulatory Visit: Payer: Self-pay

## 2021-08-26 ENCOUNTER — Encounter (HOSPITAL_BASED_OUTPATIENT_CLINIC_OR_DEPARTMENT_OTHER): Payer: Self-pay

## 2021-08-26 ENCOUNTER — Emergency Department (HOSPITAL_BASED_OUTPATIENT_CLINIC_OR_DEPARTMENT_OTHER)
Admission: EM | Admit: 2021-08-26 | Discharge: 2021-08-26 | Disposition: A | Discharge status: Home / Self Care | Payer: 59 | Attending: Emergency Medicine | Admitting: Emergency Medicine

## 2021-08-26 DIAGNOSIS — R221 Localized swelling, mass and lump, neck: Secondary | ICD-10-CM | POA: Diagnosis not present

## 2021-08-26 DIAGNOSIS — Z5321 Procedure and treatment not carried out due to patient leaving prior to being seen by health care provider: Secondary | ICD-10-CM | POA: Insufficient documentation

## 2021-08-26 DIAGNOSIS — R070 Pain in throat: Secondary | ICD-10-CM | POA: Insufficient documentation

## 2021-08-26 LAB — GROUP A STREP BY PCR: Group A Strep by PCR: NOT DETECTED

## 2021-08-26 NOTE — ED Notes (Signed)
Per registration, patient left after triage.  Unknown reason.

## 2021-08-26 NOTE — ED Triage Notes (Signed)
Patient complains of sore throat which began around 30 minutes ago.  She also complains of swelling to her neck.

## 2024-07-10 ENCOUNTER — Encounter: Payer: Self-pay | Admitting: Family Medicine

## 2024-07-10 DIAGNOSIS — R109 Unspecified abdominal pain: Secondary | ICD-10-CM

## 2024-07-13 ENCOUNTER — Other Ambulatory Visit: Payer: Self-pay | Admitting: Family Medicine

## 2024-07-13 DIAGNOSIS — R109 Unspecified abdominal pain: Secondary | ICD-10-CM

## 2024-07-17 ENCOUNTER — Other Ambulatory Visit

## 2024-07-27 ENCOUNTER — Other Ambulatory Visit

## 2024-07-30 ENCOUNTER — Other Ambulatory Visit

## 2024-07-30 DIAGNOSIS — R109 Unspecified abdominal pain: Secondary | ICD-10-CM
